# Patient Record
Sex: Female | Born: 1940 | Race: White | Hispanic: No | Marital: Married | Smoking: Never smoker
Health system: Southern US, Community
[De-identification: ages and names within clinical notes are randomized; demographics above are authoritative.]

## PROBLEM LIST (undated history)

## (undated) DIAGNOSIS — E7800 Pure hypercholesterolemia, unspecified: Secondary | ICD-10-CM

## (undated) DIAGNOSIS — E119 Type 2 diabetes mellitus without complications: Secondary | ICD-10-CM

## (undated) HISTORY — PX: CHOLECYSTECTOMY: SHX55

---

## 2012-12-08 ENCOUNTER — Encounter (HOSPITAL_BASED_OUTPATIENT_CLINIC_OR_DEPARTMENT_OTHER): Payer: Self-pay | Admitting: Emergency Medicine

## 2012-12-08 ENCOUNTER — Emergency Department (HOSPITAL_BASED_OUTPATIENT_CLINIC_OR_DEPARTMENT_OTHER): Payer: Medicare PPO

## 2012-12-08 ENCOUNTER — Emergency Department (HOSPITAL_BASED_OUTPATIENT_CLINIC_OR_DEPARTMENT_OTHER)
Admission: EM | Admit: 2012-12-08 | Discharge: 2012-12-09 | Disposition: A | Discharge status: Home / Self Care | Payer: Medicare PPO | Attending: Emergency Medicine | Admitting: Emergency Medicine

## 2012-12-08 DIAGNOSIS — E7800 Pure hypercholesterolemia, unspecified: Secondary | ICD-10-CM | POA: Insufficient documentation

## 2012-12-08 DIAGNOSIS — Z79899 Other long term (current) drug therapy: Secondary | ICD-10-CM | POA: Insufficient documentation

## 2012-12-08 DIAGNOSIS — J3489 Other specified disorders of nose and nasal sinuses: Secondary | ICD-10-CM | POA: Insufficient documentation

## 2012-12-08 DIAGNOSIS — R599 Enlarged lymph nodes, unspecified: Secondary | ICD-10-CM | POA: Insufficient documentation

## 2012-12-08 DIAGNOSIS — E119 Type 2 diabetes mellitus without complications: Secondary | ICD-10-CM | POA: Insufficient documentation

## 2012-12-08 DIAGNOSIS — R51 Headache: Secondary | ICD-10-CM | POA: Insufficient documentation

## 2012-12-08 DIAGNOSIS — R1110 Vomiting, unspecified: Secondary | ICD-10-CM | POA: Insufficient documentation

## 2012-12-08 DIAGNOSIS — H53149 Visual discomfort, unspecified: Secondary | ICD-10-CM | POA: Insufficient documentation

## 2012-12-08 DIAGNOSIS — R42 Dizziness and giddiness: Secondary | ICD-10-CM | POA: Insufficient documentation

## 2012-12-08 DIAGNOSIS — Z7982 Long term (current) use of aspirin: Secondary | ICD-10-CM | POA: Insufficient documentation

## 2012-12-08 DIAGNOSIS — R000 Tachycardia, unspecified: Secondary | ICD-10-CM | POA: Insufficient documentation

## 2012-12-08 HISTORY — DX: Type 2 diabetes mellitus without complications: E11.9

## 2012-12-08 HISTORY — DX: Pure hypercholesterolemia, unspecified: E78.00

## 2012-12-08 MED ORDER — KETOROLAC TROMETHAMINE 30 MG/ML IJ SOLN
30.0000 mg | Freq: Once | INTRAMUSCULAR | Status: AC
  Administered 2012-12-08: 30 mg via INTRAVENOUS
  Filled 2012-12-08: qty 1

## 2012-12-08 MED ORDER — SODIUM CHLORIDE 0.9 % IV BOLUS (SEPSIS)
500.0000 mL | Freq: Once | INTRAVENOUS | Status: AC
  Administered 2012-12-08: 500 mL via INTRAVENOUS

## 2012-12-08 MED ORDER — PROMETHAZINE HCL 25 MG/ML IJ SOLN
12.5000 mg | Freq: Once | INTRAMUSCULAR | Status: AC
  Administered 2012-12-08: 12.5 mg via INTRAVENOUS
  Filled 2012-12-08: qty 1

## 2012-12-08 NOTE — ED Notes (Signed)
Headache x 3 days. Was seen at [facility] yesterday and started on amoxicillin for sinusitis. She has had one dose. C.o dizziness afterward.

## 2012-12-08 NOTE — ED Provider Notes (Addendum)
CSN: [id number]     Arrival date & time 12/08/12  2021 History  This chart was scribed for [employer]. Brianna Payor, MD by Blanchard Kelch, ED Scribe. The patient was seen in room MH01/MH01. Patient's care was started at 8:59 PM.     Chief Complaint  Patient presents with  . Headache    Patient is a 72 y.o. female presenting with headaches. The history is provided by the patient. No language interpreter was used.  Headache Associated symptoms: congestion, dizziness, drainage, photophobia, sinus pressure and vomiting     HPI Comments: Brianna Miller is a 72 y.o. female who presents to the Emergency Department complaining of a constant, worsening headache that began three days ago. She rates the pain a 9/10 currently. She had associated bilateral sinus pressure, photophobia, post nasal drip and congestion. She was seen at [facility] yesterday for the symptoms and was diagnosed with a sinus infection. She was given an injection of antibiotics as well as a prescription for amoxicillin. She took one dose this morning and got light-headed and dizzy and began vomiting about an hour after taking the medication. She called the provider at [facility] and reports they changed her antibiotics to a z-pack. She had taken Tylenol for the headache today without relief. She denies that she a history of recurrent headaches or sinus infections. She occasionally gets dizzy due to blood sugar levels but states that her blood sugar was 105 prior to coming in to the ED.   Past Medical History  Diagnosis Date  . Diabetes mellitus without complication   . High cholesterol    Past Surgical History  Procedure Laterality Date  . Cholecystectomy     No family history on file. History  Substance Use Topics  . Smoking status: Never Smoker   . Smokeless tobacco: Not on file  . Alcohol Use: No   OB History   Grav Para Term Preterm Abortions TAB SAB Ect Mult Living                 Review of Systems  HENT: Positive for  congestion, postnasal drip and sinus pressure.   Eyes: Positive for photophobia.  Gastrointestinal: Positive for vomiting.  Neurological: Positive for dizziness, light-headedness and headaches.  All other systems reviewed and are negative.    Allergies  Review of patient's allergies indicates no known allergies.  Home Medications   Current Outpatient Rx  Name  Route  Sig  Dispense  Refill  . aspirin 81 MG tablet   Oral   Take 81 mg by mouth daily.         . Calcium Carbonate-Vitamin D (CALCIUM + D PO)   Oral   Take by mouth.         Marland Kitchen glimepiride (AMARYL) 2 MG tablet   Oral   Take 2 mg by mouth daily with breakfast.         . Iron Combinations (IRON COMPLEX PO)   Oral   Take by mouth.         Marland Kitchen LOVASTATIN PO   Oral   Take by mouth.         . METFORMIN HCL PO   Oral   Take by mouth.         . Potassium (POTASSIMIN PO)   Oral   Take by mouth.         . QUINAPRIL HCL PO   Oral   Take by mouth.  Triage Vitals: BP 159/87  Pulse 102  Temp(Src) 97.8 F (36.6 C) (Oral)  Resp 18  Ht 5\' 3"  (1.6 m)  Wt 170 lb (77.111 kg)  BMI 30.12 kg/m2  SpO2 96%  Physical Exam  Nursing note and vitals reviewed. Constitutional: She is oriented to person, place, and time. She appears well-developed and well-nourished.  Mildly uncomfortable looking upon exam  HENT:  Head: Normocephalic and atraumatic.  Left Ear: Tympanic membrane normal.  Tenderness bilateral maxillary sinuses. No erythema. Right TM obscured by cerumen, left TM normal.  Eyes: EOM are normal. Left eye exhibits no nystagmus.  Mild photophobia  Neck:  Mild anterior cervical adenopathy.  Cardiovascular: Regular rhythm.  Tachycardia present.   Mild tachycardia  Pulmonary/Chest: Effort normal. She has no wheezes. She has no rales.  Abdominal: Soft. There is no tenderness. There is no rebound and no guarding.  Lymphadenopathy:    She has cervical adenopathy.  Neurological: She is alert  and oriented to person, place, and time.  Skin: Skin is warm and dry.    ED Course  Procedures (including critical care time)     COORDINATION OF CARE: 9:05 PM - Patient verbalizes understanding and agrees with treatment plan.    Labs Review Labs Reviewed - No data to display Imaging Review No results found.  EKG Interpretation   None       MDM  No diagnosis found. I personally performed the services described in this documentation, which was scribed in my presence. The recorded information has been reviewed and is accurate.  Patient presents with a headache. She was recently diagnosed with a sinus infection. She had a first dose of amoxicillin this morning an hour she began to feel dizziness, which was a sensation of the room moving around and a worsening headache on the left side of her head on the front of her head. CBG was 105 at home. Patient has had an improvement of the nausea and a mild improvement in the headache with migraine cocktail. Head CT is done.    Brianna Miller. Brianna Payor, MD 12/08/12 2323  Patient with headache. CT reassuring. Doubt severe intracranial abnormality this time. Patient is feeling somewhat better. Will discharge home to follow with her PCP as needed.  Brianna Miller. Brianna Payor, MD 12/08/12 [phone]

## 2018-02-06 ENCOUNTER — Encounter (HOSPITAL_BASED_OUTPATIENT_CLINIC_OR_DEPARTMENT_OTHER): Payer: Self-pay | Admitting: *Deleted

## 2018-02-06 ENCOUNTER — Other Ambulatory Visit: Payer: Self-pay

## 2018-02-06 ENCOUNTER — Emergency Department (HOSPITAL_BASED_OUTPATIENT_CLINIC_OR_DEPARTMENT_OTHER)
Admission: EM | Admit: 2018-02-06 | Discharge: 2018-02-06 | Disposition: A | Discharge status: Home / Self Care | Payer: Medicare Other | Attending: Emergency Medicine | Admitting: Emergency Medicine

## 2018-02-06 ENCOUNTER — Emergency Department (HOSPITAL_BASED_OUTPATIENT_CLINIC_OR_DEPARTMENT_OTHER): Payer: Medicare Other

## 2018-02-06 DIAGNOSIS — Z9049 Acquired absence of other specified parts of digestive tract: Secondary | ICD-10-CM | POA: Diagnosis not present

## 2018-02-06 DIAGNOSIS — S0181XA Laceration without foreign body of other part of head, initial encounter: Secondary | ICD-10-CM | POA: Diagnosis not present

## 2018-02-06 DIAGNOSIS — W19XXXA Unspecified fall, initial encounter: Secondary | ICD-10-CM

## 2018-02-06 DIAGNOSIS — E119 Type 2 diabetes mellitus without complications: Secondary | ICD-10-CM | POA: Diagnosis not present

## 2018-02-06 DIAGNOSIS — Z7982 Long term (current) use of aspirin: Secondary | ICD-10-CM | POA: Insufficient documentation

## 2018-02-06 DIAGNOSIS — Y929 Unspecified place or not applicable: Secondary | ICD-10-CM | POA: Insufficient documentation

## 2018-02-06 DIAGNOSIS — Y9389 Activity, other specified: Secondary | ICD-10-CM | POA: Insufficient documentation

## 2018-02-06 DIAGNOSIS — Z7984 Long term (current) use of oral hypoglycemic drugs: Secondary | ICD-10-CM | POA: Diagnosis not present

## 2018-02-06 DIAGNOSIS — Y998 Other external cause status: Secondary | ICD-10-CM | POA: Diagnosis not present

## 2018-02-06 DIAGNOSIS — Z79899 Other long term (current) drug therapy: Secondary | ICD-10-CM | POA: Diagnosis not present

## 2018-02-06 DIAGNOSIS — S0993XA Unspecified injury of face, initial encounter: Secondary | ICD-10-CM | POA: Diagnosis present

## 2018-02-06 DIAGNOSIS — W108XXA Fall (on) (from) other stairs and steps, initial encounter: Secondary | ICD-10-CM | POA: Insufficient documentation

## 2018-02-06 LAB — CBG MONITORING, ED: Glucose-Capillary: 135 mg/dL — ABNORMAL HIGH (ref 70–99)

## 2018-02-06 MED ORDER — BACITRACIN ZINC 500 UNIT/GM EX OINT
TOPICAL_OINTMENT | Freq: Two times a day (BID) | CUTANEOUS | Status: DC
  Administered 2018-02-06: 1 via TOPICAL

## 2018-02-06 MED ORDER — LIDOCAINE-EPINEPHRINE (PF) 2 %-1:200000 IJ SOLN
10.0000 mL | Freq: Once | INTRAMUSCULAR | Status: AC
  Administered 2018-02-06: 10 mL
  Filled 2018-02-06 (×2): qty 10

## 2018-02-06 NOTE — Discharge Instructions (Addendum)
Wound check in 2 days. Suture removal in 5-7 days.

## 2018-02-06 NOTE — ED Provider Notes (Signed)
[facility] EMERGENCY DEPARTMENT Provider Note   CSN: [id number] Arrival date & time: 02/06/18  1802     History   Chief Complaint Chief Complaint  Patient presents with  . Fall  . Laceration    HPI Brianna Miller is a 78 y.o. female.  78 year old female presents with laceration to her left temple area.  Patient states that she had gone up a few steps when her knees gave out on her and she fell and then rolled to her side resulting in the laceration on the side of her face.  Patient is unsure if she hit her face on the concrete or if her glasses caused the laceration.  Patient went to urgent care who informed her she would need a CT scan and they were unable to perform this recommend patient go to the emergency room.  Patient denies loss of consciousness, is not on blood thinners, denies visual changes, nausea, vomiting.  Patient has been ambulatory without difficulty since the fall, no other injuries, complaints, concerns.     Past Medical History:  Diagnosis Date  . Diabetes mellitus without complication (HCC)   . High cholesterol     There are no active problems to display for this patient.   Past Surgical History:  Procedure Laterality Date  . CHOLECYSTECTOMY       OB History   No obstetric history on file.      Home Medications    Prior to Admission medications   Medication Sig Start Date End Date Taking? Authorizing Provider  aspirin 81 MG tablet Take 81 mg by mouth daily.   Yes [provider]  Calcium Carbonate-Vitamin D (CALCIUM + D PO) Take by mouth.   Yes [provider]  GABAPENTIN PO Take by mouth.   Yes [provider]  glimepiride (AMARYL) 2 MG tablet Take 2 mg by mouth daily with breakfast.   Yes [provider]  Iron Combinations (IRON COMPLEX PO) Take by mouth.   Yes [provider]  LOVASTATIN PO Take by mouth.   Yes [provider]  METFORMIN HCL PO Take by mouth.   Yes [provider]  Potassium (POTASSIMIN PO) Take by mouth.   Yes [provider]  QUINAPRIL HCL PO Take by mouth.   Yes [provider]    Family History No family history on file.  Social History Social History   Tobacco Use  . Smoking status: Never Smoker  . Smokeless tobacco: Never Used  Substance Use Topics  . Alcohol use: No  . Drug use: No     Allergies   Patient has no known allergies.   Review of Systems Review of Systems  Constitutional: Negative for fever.  Eyes: Negative for visual disturbance.  Gastrointestinal: Negative for nausea and vomiting.  Musculoskeletal: Negative for arthralgias, back pain, gait problem, neck pain and neck stiffness.  Skin: Positive for wound.  Allergic/Immunologic: Negative for immunocompromised state.  Neurological: Negative for dizziness, weakness and headaches.  Hematological: Does not bruise/bleed easily.  Psychiatric/Behavioral: Negative for confusion.  All other systems reviewed and are negative.    Physical Exam Updated Vital Signs BP (!) 160/77 (BP Location: Right Arm)   Pulse 68   Temp 98.1 F (36.7 C) (Oral)   Resp 18   Ht 5\' 3"  (1.6 m)   Wt 73.5 kg   SpO2 99%   BMI 28.70 kg/m   Physical Exam Vitals signs and nursing note reviewed.  Constitutional:  General: She is not in acute distress.    Appearance: She is well-developed. She is not diaphoretic.  HENT:     Head: Normocephalic.   Eyes:     Extraocular Movements: Extraocular movements intact.     Pupils: Pupils are equal, round, and reactive to light.  Pulmonary:     Effort: Pulmonary effort is normal.  Musculoskeletal:     Cervical back: She exhibits normal range of motion, no tenderness and no bony tenderness.  Skin:    General: Skin is warm and dry.     Findings: No erythema or rash.  Neurological:     General: No focal deficit present.     Mental Status: She is alert and oriented to person, place, and time.  Psychiatric:         Behavior: Behavior normal.      ED Treatments / Results  Labs (all labs ordered are listed, but only abnormal results are displayed) Labs Reviewed  CBG MONITORING, ED - Abnormal; Notable for the following components:      Result Value   Glucose-Capillary 135 (*)    All other components within normal limits    EKG None  Radiology Ct Maxillofacial Wo Contrast  Result Date: 02/06/2018 CLINICAL DATA:  Status post fall.  Facial trauma. EXAM: CT MAXILLOFACIAL WITHOUT CONTRAST TECHNIQUE: Multidetector CT imaging of the maxillofacial structures was performed. Multiplanar CT image reconstructions were also generated. COMPARISON:  None. FINDINGS: Osseous: No fracture or mandibular dislocation. No destructive process. Orbits: Negative. No traumatic or inflammatory finding. Sinuses: Clear. Soft tissues: Left superolateral orbital soft tissue laceration. Limited intracranial: No significant or unexpected finding. Intracranial atherosclerotic disease. IMPRESSION: 1.  No acute osseous injury of the maxillofacial bones. Electronically Signed   By: Elige Ko   On: 02/06/2018 22:07    Procedures .Marland KitchenLaceration Repair Date/Time: 02/06/2018 11:01 PM Performed by: Jeannie Fend, PA-C Authorized by: Jeannie Fend, PA-C   Consent:    Consent obtained:  Verbal   Consent given by:  Patient   Risks discussed:  Infection, need for additional repair, pain, poor cosmetic result and poor wound healing   Alternatives discussed:  No treatment and delayed treatment Universal protocol:    Procedure explained and questions answered to patient or proxy's satisfaction: yes     Relevant documents present and verified: yes     Test results available and properly labeled: yes     Imaging studies available: yes     Required blood products, implants, devices, and special equipment available: yes     Site/side marked: yes     Immediately prior to procedure, a time out was called: yes     Patient identity  confirmed:  Verbally with patient Anesthesia (see MAR for exact dosages):    Anesthesia method:  Local infiltration   Local anesthetic:  Lidocaine 2% WITH epi Laceration details:    Location:  Face   Facial location: left temple.   Length (cm):  5   Depth (mm):  5 Repair type:    Repair type:  Simple Pre-procedure details:    Preparation:  Patient was prepped and draped in usual sterile fashion and imaging obtained to evaluate for foreign bodies Exploration:    Hemostasis achieved with:  Epinephrine   Wound exploration: wound explored through full range of motion and entire depth of wound probed and visualized     Wound extent: no foreign bodies/material noted, no muscle damage noted and no underlying fracture noted  Contaminated: no   Treatment:    Area cleansed with:  Saline   Amount of cleaning:  Standard   Irrigation solution:  Sterile saline Skin repair:    Repair method:  Sutures   Suture size:  5-0   Suture material:  Prolene   Suture technique:  Simple interrupted   Number of sutures:  5 (4 simple interrupted, 1 mattress) Approximation:    Approximation:  Close Post-procedure details:    Dressing:  Antibiotic ointment and bulky dressing   Patient tolerance of procedure:  Tolerated well, no immediate complications   (including critical care time)  Medications Ordered in ED Medications  bacitracin ointment (1 application Topical Given 02/06/18 2301)  lidocaine-EPINEPHrine (XYLOCAINE W/EPI) 2 %-1:200000 (PF) injection 10 mL (10 mLs Infiltration Given 02/06/18 2119)     Initial Impression / Assessment and Plan / ED Course  I have reviewed the triage vital signs and the nursing notes.  Pertinent labs & imaging results that were available during my care of the patient were reviewed by me and considered in my medical decision making (see chart for details).  Clinical Course as of Feb 07 2304  Thu Feb 06, 2018  [id number] 78 year old female presents with laceration to left  temple area after a fall tonight.  Patient denies feeling weak or dizzy prior to the fall, denies loss of consciousness, is not taking blood thinner.  On exam patient has a 5 cm V-shaped flap laceration to the left temple area.  Patient is ambulatory without assistance, no pain with movement of major joints, no neck or back tenderness. CT scan is negative for fracture.  Discussed with patient and family at bedside flap part of the laceration can be difficult to heal well. Recommend wound check with PCP in 2 days, suture removal in 5-7 days. Small amount of oozing present after closure stopped with gentle pressure. Bacitracin and dressing placed at discharge, instructed to leave bandage in place until tomorrow, may change dressing tomorrow.    [LM]    Clinical Course User Index [LM] Jeannie FendMurphy, Kanita Delage A, PA-C   Final Clinical Impressions(s) / ED Diagnoses   Final diagnoses:  Fall, initial encounter  Facial laceration, initial encounter    ED Discharge Orders    None       Jeannie FendMurphy, Mikailah Morel A, PA-C 02/06/18 2306    Vanetta MuldersZackowski, Scott, MD 02/07/18 1550

## 2018-02-06 NOTE — ED Triage Notes (Signed)
She fell down 5 concrete steps. Laceration to the left side of her face. Her left hip and arm are both sore.

## 2018-02-06 NOTE — ED Notes (Signed)
Pt is angry at wait time. States she has seen other pts go back ahead of her. I explained to pt we go by acuity or sickest pts first and we have not forgotten her. States she will just leave and go to her MD tomorrow. I advised pt against leaving and told her most lacerations cannot be sutured after 12 hours due to risk of infection. States she is getting mad and sore the longer she waits.

## 2018-02-06 NOTE — ED Notes (Signed)
ED Provider at bedside. 

## 2019-11-25 IMAGING — CT CT MAXILLOFACIAL W/O CM
3 series · 15 of 47 positions shown, 18 images · non-contrast
Comparison: None.

CLINICAL DATA: Status post fall.  Facial trauma.

EXAM:
CT MAXILLOFACIAL WITHOUT CONTRAST
TECHNIQUE: Multidetector CT imaging of the maxillofacial structures was
performed. Multiplanar CT image reconstructions were also generated.

[Series 2: max soft · axial · 0.33mm/px · z∈[+1086,+1188]mm · 9 of 61 slices shown, 12 images]
[im 5/61  brain]
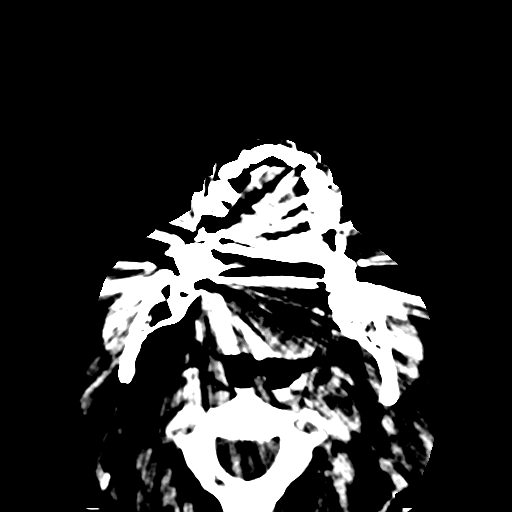
[im 5/61  bone]
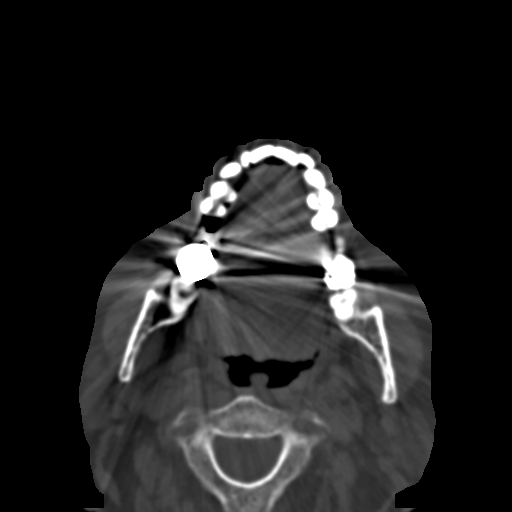
[im 11/61  bone]
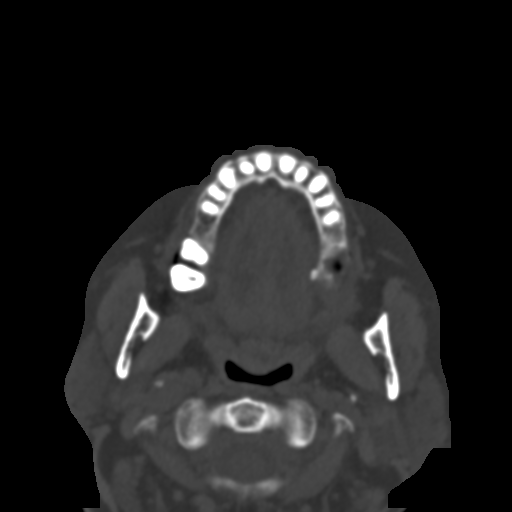
[im 17/61  bone]
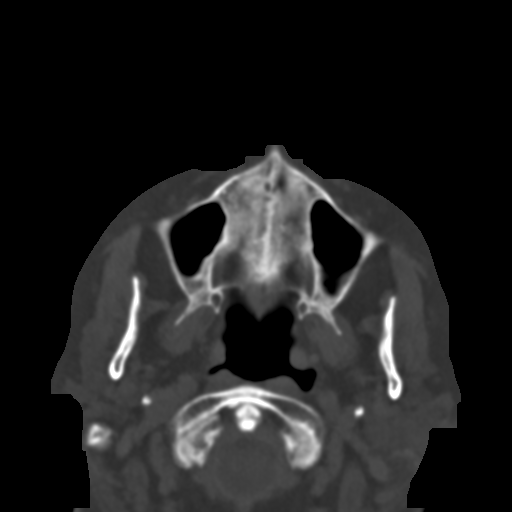
[im 23/61  bone]
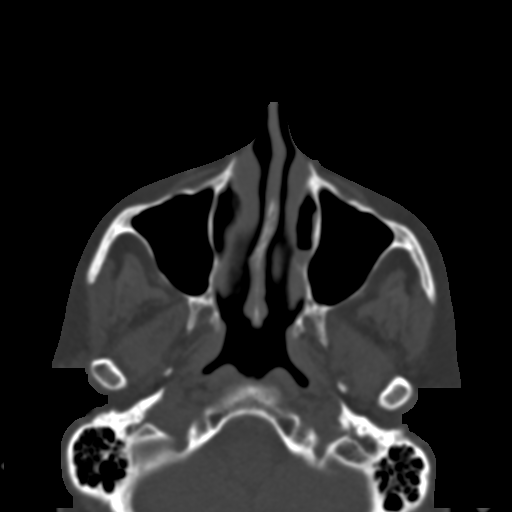
[im 32/61  brain]
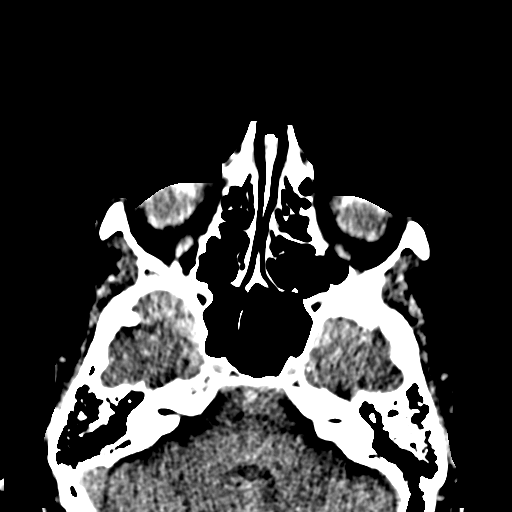
[im 32/61  bone]
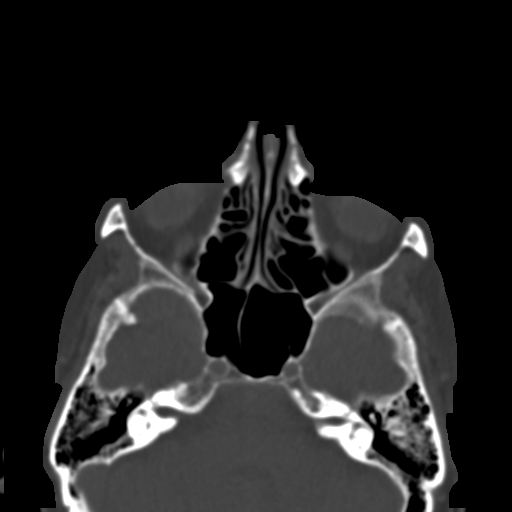
[im 38/61  bone]
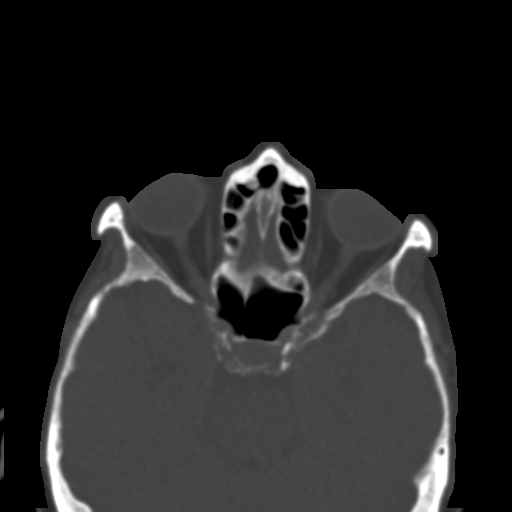
[im 44/61  bone]
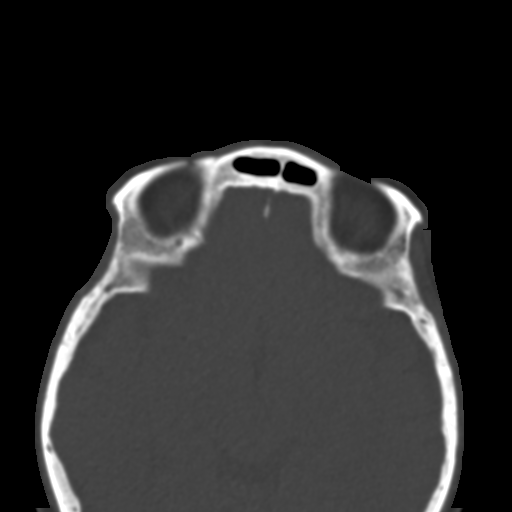
[im 50/61  bone]
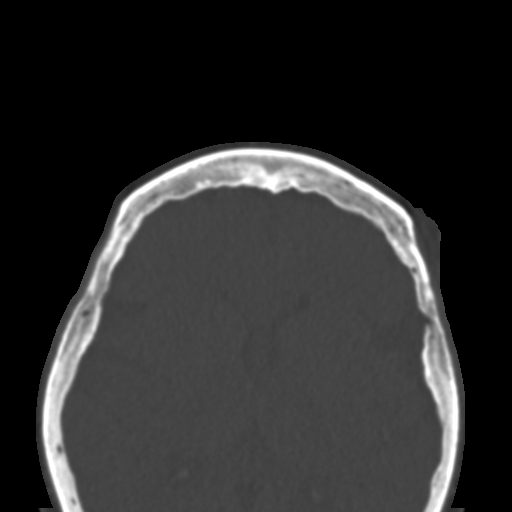
[im 56/61  brain]
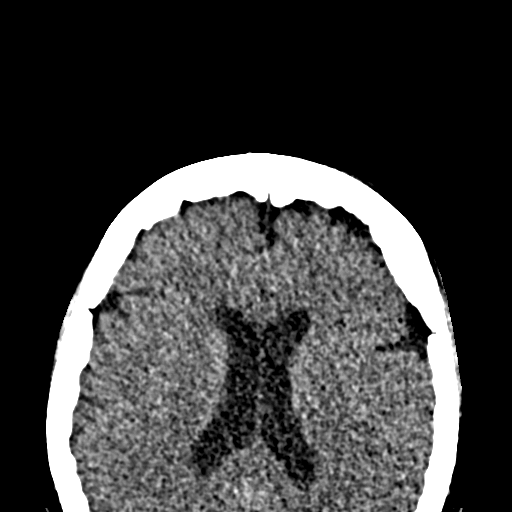
[im 56/61  bone]
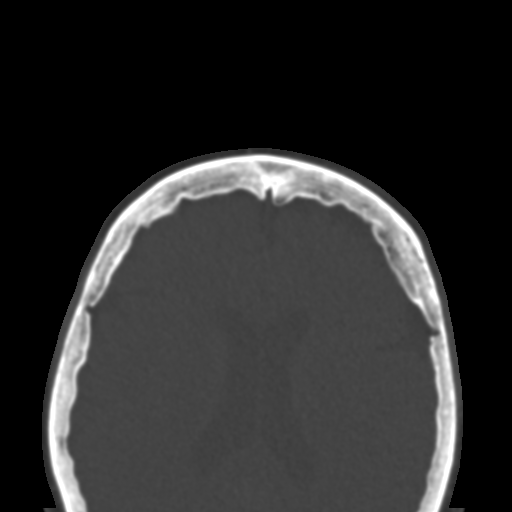

[Series 7: coronal soft · coronal · 0.26mm/px · 3 of 70 slices shown]
[im 24/70  bone]
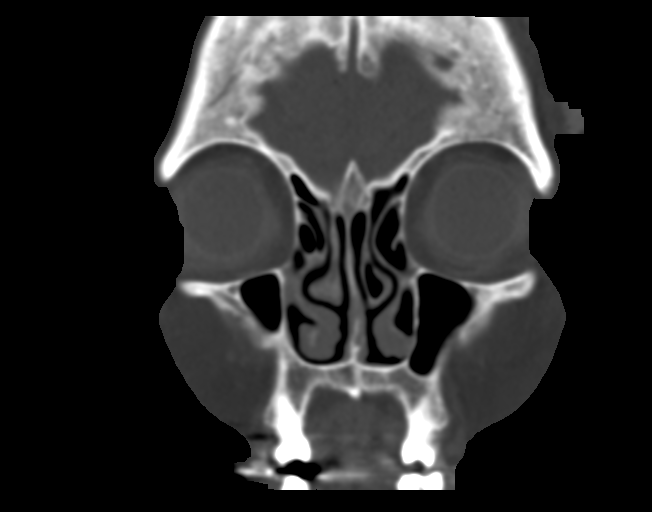
[im 31/70  bone]
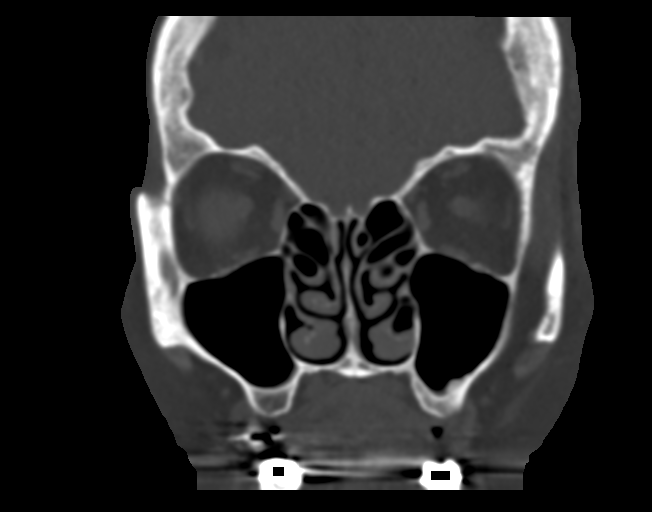
[im 39/70  bone]
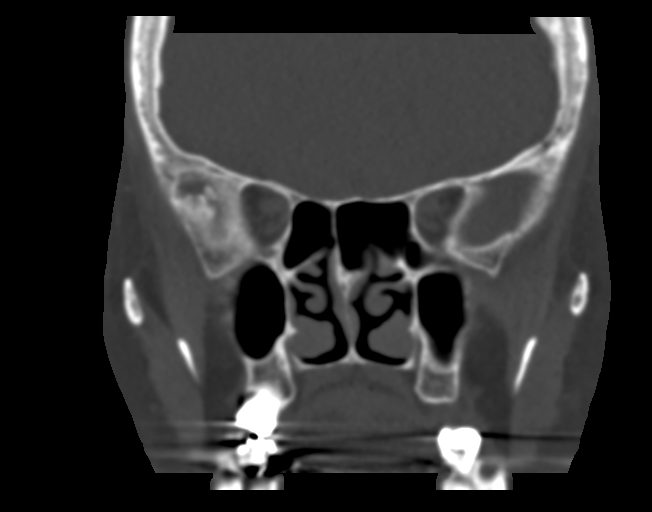

[Series 8: sagittal soft · sagittal · 0.24mm/px · 3 of 82 slices shown]
[im 28/82  bone]
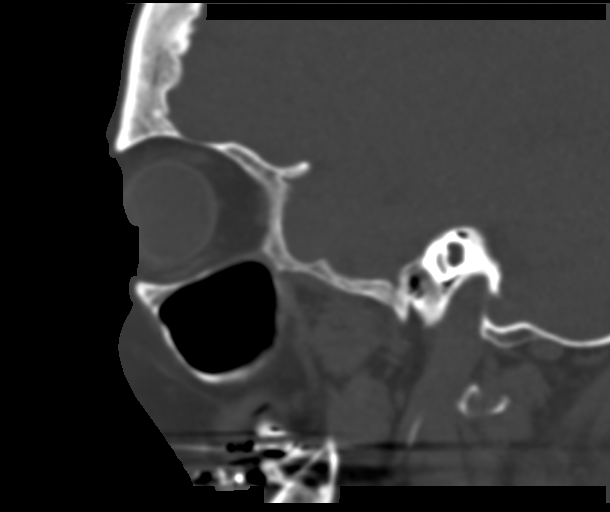
[im 41/82  bone]
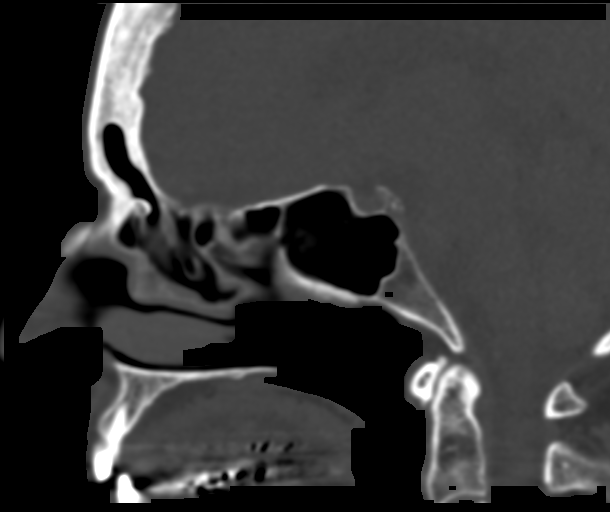
[im 55/82  bone]
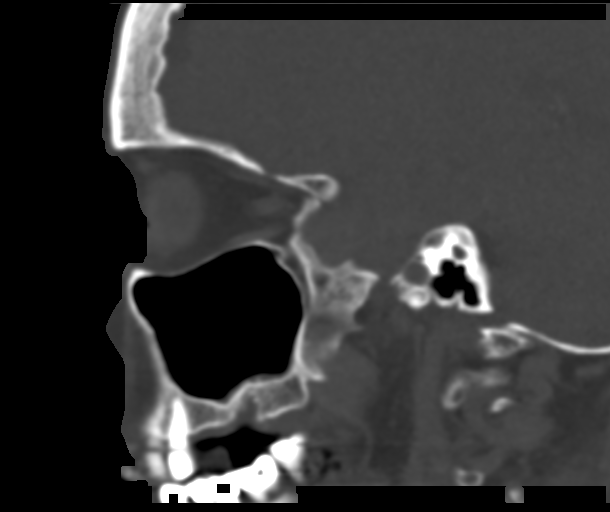

[15 of 47 positions shown; findings below may reference images not displayed]

FINDINGS: Osseous: No fracture or mandibular dislocation. No destructive
process.

Orbits: Negative. No traumatic or inflammatory finding.

Sinuses: Clear.

Soft tissues: Left superolateral orbital soft tissue laceration.

Limited intracranial: No significant or unexpected finding.
Intracranial atherosclerotic disease.
IMPRESSION: 1.  No acute osseous injury of the maxillofacial bones.
